# Patient Record
Sex: Male | Born: 1955 | Race: White | Hispanic: No | Marital: Married | Smoking: Current every day smoker
Health system: Southern US, Community
[De-identification: ages and names within clinical notes are randomized; demographics above are authoritative.]

## PROBLEM LIST (undated history)

## (undated) DIAGNOSIS — F419 Anxiety disorder, unspecified: Secondary | ICD-10-CM

## (undated) DIAGNOSIS — F329 Major depressive disorder, single episode, unspecified: Secondary | ICD-10-CM

## (undated) DIAGNOSIS — F32A Depression, unspecified: Secondary | ICD-10-CM

## (undated) DIAGNOSIS — K219 Gastro-esophageal reflux disease without esophagitis: Secondary | ICD-10-CM

## (undated) HISTORY — PX: CHOLECYSTECTOMY: SHX55

---

## 1999-02-13 ENCOUNTER — Ambulatory Visit: Admission: RE | Admit: 1999-02-13 | Discharge: 1999-02-13 | Payer: Self-pay | Admitting: Internal Medicine

## 2002-03-19 ENCOUNTER — Encounter: Payer: Self-pay | Admitting: Orthopedic Surgery

## 2002-03-23 ENCOUNTER — Inpatient Hospital Stay (HOSPITAL_COMMUNITY): Admission: RE | Admit: 2002-03-23 | Discharge: 2002-03-25 | Payer: Self-pay | Admitting: Orthopedic Surgery

## 2015-10-04 ENCOUNTER — Emergency Department: Payer: PRIVATE HEALTH INSURANCE

## 2015-10-04 ENCOUNTER — Emergency Department
Admission: EM | Admit: 2015-10-04 | Discharge: 2015-10-04 | Disposition: A | Discharge status: Home / Self Care | Payer: PRIVATE HEALTH INSURANCE | Attending: Emergency Medicine | Admitting: Emergency Medicine

## 2015-10-04 DIAGNOSIS — W01198A Fall on same level from slipping, tripping and stumbling with subsequent striking against other object, initial encounter: Secondary | ICD-10-CM | POA: Diagnosis not present

## 2015-10-04 DIAGNOSIS — F419 Anxiety disorder, unspecified: Secondary | ICD-10-CM | POA: Diagnosis not present

## 2015-10-04 DIAGNOSIS — S2231XA Fracture of one rib, right side, initial encounter for closed fracture: Secondary | ICD-10-CM

## 2015-10-04 DIAGNOSIS — F329 Major depressive disorder, single episode, unspecified: Secondary | ICD-10-CM | POA: Diagnosis not present

## 2015-10-04 DIAGNOSIS — S2241XA Multiple fractures of ribs, right side, initial encounter for closed fracture: Secondary | ICD-10-CM | POA: Insufficient documentation

## 2015-10-04 DIAGNOSIS — S299XXA Unspecified injury of thorax, initial encounter: Secondary | ICD-10-CM | POA: Diagnosis present

## 2015-10-04 DIAGNOSIS — Z79899 Other long term (current) drug therapy: Secondary | ICD-10-CM | POA: Diagnosis not present

## 2015-10-04 DIAGNOSIS — Y9289 Other specified places as the place of occurrence of the external cause: Secondary | ICD-10-CM | POA: Diagnosis not present

## 2015-10-04 DIAGNOSIS — Z720 Tobacco use: Secondary | ICD-10-CM | POA: Diagnosis not present

## 2015-10-04 DIAGNOSIS — Z880 Allergy status to penicillin: Secondary | ICD-10-CM | POA: Insufficient documentation

## 2015-10-04 DIAGNOSIS — Y9389 Activity, other specified: Secondary | ICD-10-CM | POA: Insufficient documentation

## 2015-10-04 DIAGNOSIS — Y998 Other external cause status: Secondary | ICD-10-CM | POA: Diagnosis not present

## 2015-10-04 HISTORY — DX: Depression, unspecified: F32.A

## 2015-10-04 HISTORY — DX: Major depressive disorder, single episode, unspecified: F32.9

## 2015-10-04 HISTORY — DX: Gastro-esophageal reflux disease without esophagitis: K21.9

## 2015-10-04 HISTORY — DX: Anxiety disorder, unspecified: F41.9

## 2015-10-04 MED ORDER — IBUPROFEN 800 MG PO TABS: 800.0000 mg | ORAL_TABLET | Freq: Three times a day (TID) | ORAL | Status: AC | PRN

## 2015-10-04 MED ORDER — OXYCODONE-ACETAMINOPHEN 7.5-325 MG PO TABS: 1.0000 | ORAL_TABLET | ORAL | Status: AC | PRN

## 2015-10-04 MED ORDER — KETOROLAC TROMETHAMINE 60 MG/2ML IM SOLN
60.0000 mg | Freq: Once | INTRAMUSCULAR | Status: AC
  Administered 2015-10-04: 60 mg via INTRAMUSCULAR
  Filled 2015-10-04: qty 2

## 2015-10-04 MED ORDER — HYDROMORPHONE HCL 1 MG/ML IJ SOLN
1.0000 mg | Freq: Once | INTRAMUSCULAR | Status: AC
  Administered 2015-10-04: 1 mg via INTRAMUSCULAR
  Filled 2015-10-04: qty 1

## 2015-10-04 NOTE — Discharge Instructions (Signed)
Rib Fracture °A rib fracture is a break or crack in one of the bones of the ribs. The ribs are like a cage that goes around your upper chest. A broken or cracked rib is often painful, but most do not cause other problems. Most rib fractures heal on their own in 1-3 months. °HOME CARE °· Avoid activities that cause pain to the injured area. Protect your injured area. °· Slowly increase activity as told by your doctor. °· Take medicine as told by your doctor. °· Put ice on the injured area for the first 1-2 days after you have been treated or as told by your doctor. °¨ Put ice in a plastic bag. °¨ Place a towel between your skin and the bag. °¨ Leave the ice on for 15-20 minutes at a time, every 2 hours while you are awake. °· Do deep breathing as told by your doctor. You may be told to: °¨ Take deep breaths many times a day. °¨ Cough many times a day while hugging a pillow. °¨ Use a device (incentive spirometer) to perform deep breathing many times a day. °· Drink enough fluids to keep your pee (urine) clear or pale yellow.   °· Do not wear a rib belt or binder. These do not allow you to breathe deeply. °GET HELP RIGHT AWAY IF:  °· You have a fever. °· You have trouble breathing.   °· You cannot stop coughing. °· You cough up thick or bloody spit (mucus).   °· You feel sick to your stomach (nauseous), throw up (vomit), or have belly (abdominal) pain.   °· Your pain gets worse and medicine does not help.   °MAKE SURE YOU:  °· Understand these instructions. °· Will watch your condition. °· Will get help right away if you are not doing well or get worse. °  °This information is not intended to replace advice given to you by your health care provider. Make sure you discuss any questions you have with your health care provider. °  °Document Released: 08/21/2008 Document Revised: 03/09/2013 Document Reviewed: 01/14/2013 °Elsevier Interactive Patient Education ©2016 [employer] ° °

## 2015-10-04 NOTE — ED Provider Notes (Signed)
[facility] Emergency Department Provider Note  ____________________________________________  Time seen: Approximately 12:32 PM  I have reviewed the triage vital signs and the nursing notes.   HISTORY  Chief Complaint Fall    HPI Micheal Frost is a 59 y.o. male patient complain right rib pain status post 3 falls in the past 4 days. Patient states pain increases with deep inspiration and coughing. Patient say his falls a being caused by tripping and a change in his medication he takes anxiety. Patient is rating his pain as a 10 over 10 described as sharp with deep inspiration or coughing. No palliative measures taken for this complaint. Patient last fall was prior to arrival.   Past Medical History  Diagnosis Date  . Anxiety   . GERD (gastroesophageal reflux disease)   . Depression     There are no active problems to display for this patient.   Past Surgical History  Procedure Laterality Date  . Cholecystectomy      Current Outpatient Rx  Name  Route  Sig  Dispense  Refill  . clonazePAM (KLONOPIN) 0.5 MG tablet   Oral   Take 0.5 mg by mouth 2 (two) times daily as needed for anxiety.         Marland Kitchen losartan (COZAAR) 50 MG tablet   Oral   Take 50 mg by mouth daily.         Marland Kitchen omeprazole (PRILOSEC) 40 MG capsule   Oral   Take 40 mg by mouth daily.           Allergies Penicillins  History reviewed. No pertinent family history.  Social History Social History  Substance Use Topics  . Smoking status: Current Every Day Smoker  . Smokeless tobacco: None  . Alcohol Use: Yes    Review of Systems Constitutional: No fever/chills Eyes: No visual changes. ENT: No sore throat. Cardiovascular: Denies chest pain. Respiratory: Denies shortness of breath. Gastrointestinal: No abdominal pain.  No nausea, no vomiting.  No diarrhea.  No constipation. Genitourinary: Negative for dysuria. Musculoskeletal: Right rib pain Skin: Negative for  rash. Neurological: Negative for headaches, focal weakness or numbness. Psychiatric: Anxiety and depression Allergic/Immunilogical: Penicillin  10-point ROS otherwise negative.  ____________________________________________   PHYSICAL EXAM:  VITAL SIGNS: ED Triage Vitals  Enc Vitals Group     BP 10/04/15 1206 143/83 mmHg     Pulse Rate 10/04/15 1206 84     Resp 10/04/15 1206 16     Temp 10/04/15 1206 98 F (36.7 C)     Temp Source 10/04/15 1206 Oral     SpO2 10/04/15 1206 100 %     Weight 10/04/15 1206 239 lb (108.41 kg)     Height 10/04/15 1206  (1.803 m)     Head Cir --      Peak Flow --      Pain Score 10/04/15 1206 10     Pain Loc --      Pain Edu? --      Excl. in GC? --     Constitutional: Alert and oriented. Well appearing and in no acute distress. Eyes: Conjunctivae are normal. PERRL. EOMI. Head: Atraumatic. Nose: No congestion/rhinnorhea. Mouth/Throat: Mucous membranes are moist.  Oropharynx non-erythematous. Neck: No stridor.  No cervical spine tenderness to palpation. Hematological/Lymphatic/Immunilogical: No cervical lymphadenopathy. Cardiovascular: Normal rate, regular rhythm. Grossly normal heart sounds.  Good peripheral circulation. Respiratory: Normal respiratory effort.  No retractions. Lungs CTAB. Gastrointestinal: Soft and nontender. No distention. No abdominal  bruits. No CVA tenderness. Musculoskeletal: No chest wall deformity. Decreased chest expansion secondary to complain of pain with inspiration. No abrasion or ecchymosis visible.  Neurologic:  Normal speech and language. No gross focal neurologic deficits are appreciated. No gait instability. Skin:  Skin is warm, dry and intact. No rash noted. Psychiatric: Mood and affect are normal. Speech and behavior are normal.  ____________________________________________   LABS (all labs ordered are listed, but only abnormal results are displayed)  Labs Reviewed - No data to  display ____________________________________________  EKG   ____________________________________________  RADIOLOGY  X-rays reveal acute fracture right lateral ribs #3 and 4. I, Joni Reiningonald K Hue Steveson, personally viewed and evaluated these images (plain radiographs) as part of my medical decision making.   ____________________________________________   PROCEDURES  Procedure(s) performed: None  Critical Care performed: No  ____________________________________________   INITIAL IMPRESSION / ASSESSMENT AND PLAN / ED COURSE  Pertinent labs & imaging results that were available during my care of the patient were reviewed by me and considered in my medical decision making (see chart for details).  Right rib fractures. Skull is x-ray findings with patient. She is given discharge instruction for rib fractures. Patient will follow-up with family doctor home station. Patient given prescription for Percocets and ibuprofen. Patient advised return by ER if condition worsens. ____________________________________________   FINAL CLINICAL IMPRESSION(S) / ED DIAGNOSES  Final diagnoses:  Rib fractures, right, closed, initial encounter      Joni ReiningRonald K Benedicta Sultan, PA-C 10/04/15 1333  Sharman CheekPhillip Stafford, MD 10/04/15 [id number]

## 2015-10-04 NOTE — ED Notes (Signed)
Patient has had 3 falls in the past 4 days and comes in today with complaints of right lateral chest pain that hurts worse with deep breaths and coughing.  Patient reports that he has had frequent falls due to medication being changed for anxiety.  Patient reports starting new prescription klonopin 0.5 mg and antidepressant that he can not remember.

## 2021-04-15 ENCOUNTER — Emergency Department: Payer: Medicare Other

## 2021-04-15 ENCOUNTER — Ambulatory Visit (HOSPITAL_COMMUNITY)
Admission: AD | Admit: 2021-04-15 | Discharge: 2021-04-15 | Disposition: A | Discharge status: Home / Self Care | Payer: Medicare Other | Source: Other Acute Inpatient Hospital | Attending: Internal Medicine | Admitting: Internal Medicine

## 2021-04-15 ENCOUNTER — Emergency Department
Admission: EM | Admit: 2021-04-15 | Discharge: 2021-04-16 | Disposition: A | Discharge status: Other Acute Inpatient Hospital | Payer: Medicare Other | Attending: Emergency Medicine | Admitting: Emergency Medicine

## 2021-04-15 ENCOUNTER — Inpatient Hospital Stay
Admission: AD | Admit: 2021-04-15 | Payer: Medicare Other | Source: Other Acute Inpatient Hospital | Admitting: Internal Medicine

## 2021-04-15 DIAGNOSIS — G40901 Epilepsy, unspecified, not intractable, with status epilepticus: Secondary | ICD-10-CM

## 2021-04-15 DIAGNOSIS — Z978 Presence of other specified devices: Secondary | ICD-10-CM

## 2021-04-15 DIAGNOSIS — I959 Hypotension, unspecified: Secondary | ICD-10-CM

## 2021-04-15 DIAGNOSIS — R4182 Altered mental status, unspecified: Secondary | ICD-10-CM

## 2021-04-15 DIAGNOSIS — I214 Non-ST elevation (NSTEMI) myocardial infarction: Secondary | ICD-10-CM

## 2021-04-15 DIAGNOSIS — J9690 Respiratory failure, unspecified, unspecified whether with hypoxia or hypercapnia: Secondary | ICD-10-CM | POA: Insufficient documentation

## 2021-04-15 DIAGNOSIS — Z20822 Contact with and (suspected) exposure to covid-19: Secondary | ICD-10-CM | POA: Diagnosis not present

## 2021-04-15 DIAGNOSIS — R000 Tachycardia, unspecified: Secondary | ICD-10-CM | POA: Insufficient documentation

## 2021-04-15 DIAGNOSIS — R9431 Abnormal electrocardiogram [ECG] [EKG]: Secondary | ICD-10-CM | POA: Diagnosis not present

## 2021-04-15 DIAGNOSIS — I493 Ventricular premature depolarization: Secondary | ICD-10-CM | POA: Insufficient documentation

## 2021-04-15 DIAGNOSIS — R778 Other specified abnormalities of plasma proteins: Secondary | ICD-10-CM | POA: Diagnosis not present

## 2021-04-15 DIAGNOSIS — R051 Acute cough: Secondary | ICD-10-CM | POA: Diagnosis not present

## 2021-04-15 DIAGNOSIS — R569 Unspecified convulsions: Secondary | ICD-10-CM | POA: Diagnosis present

## 2021-04-15 LAB — PROTIME-INR
INR: 1.4 — ABNORMAL HIGH (ref 0.8–1.2)
Prothrombin Time: 17.3 seconds — ABNORMAL HIGH (ref 11.4–15.2)

## 2021-04-15 LAB — URINE DRUG SCREEN, QUALITATIVE (ARMC ONLY)
Amphetamines, Ur Screen: NOT DETECTED
Barbiturates, Ur Screen: NOT DETECTED
Benzodiazepine, Ur Scrn: NOT DETECTED
Cannabinoid 50 Ng, Ur ~~LOC~~: POSITIVE — AB
Cocaine Metabolite,Ur ~~LOC~~: NOT DETECTED
MDMA (Ecstasy)Ur Screen: NOT DETECTED
Methadone Scn, Ur: NOT DETECTED
Opiate, Ur Screen: NOT DETECTED
Phencyclidine (PCP) Ur S: NOT DETECTED
Tricyclic, Ur Screen: NOT DETECTED

## 2021-04-15 LAB — COMPREHENSIVE METABOLIC PANEL
ALT: 15 U/L (ref 0–44)
AST: 35 U/L (ref 15–41)
Albumin: 3.7 g/dL (ref 3.5–5.0)
Alkaline Phosphatase: 121 U/L (ref 38–126)
Anion gap: 18 — ABNORMAL HIGH (ref 5–15)
BUN: 13 mg/dL (ref 8–23)
CO2: 14 mmol/L — ABNORMAL LOW (ref 22–32)
Calcium: 9.5 mg/dL (ref 8.9–10.3)
Chloride: 106 mmol/L (ref 98–111)
Creatinine, Ser: 1.11 mg/dL (ref 0.61–1.24)
GFR, Estimated: >60 mL/min (ref >60)
Glucose, Bld: 179 mg/dL — ABNORMAL HIGH (ref 70–99)
Potassium: 4.3 mmol/L (ref 3.5–5.1)
Sodium: 138 mmol/L (ref 135–145)
Total Bilirubin: 0.7 mg/dL (ref 0.3–1.2)
Total Protein: 8 g/dL (ref 6.5–8.1)

## 2021-04-15 LAB — URINALYSIS, ROUTINE W REFLEX MICROSCOPIC
Bacteria, UA: NONE SEEN
Bilirubin Urine: NEGATIVE
Glucose, UA: 50 mg/dL — AB
Ketones, ur: NEGATIVE mg/dL
Leukocytes,Ua: NEGATIVE
Nitrite: NEGATIVE
Protein, ur: 100 mg/dL — AB
Specific Gravity, Urine: 1.02 (ref 1.005–1.030)
pH: 5 (ref 5.0–8.0)

## 2021-04-15 LAB — TROPONIN I (HIGH SENSITIVITY)
Troponin I (High Sensitivity): 19 ng/L — ABNORMAL HIGH (ref <18)
Troponin I (High Sensitivity): 321 ng/L (ref <18)

## 2021-04-15 LAB — RESP PANEL BY RT-PCR (FLU A&B, COVID) ARPGX2
Influenza A by PCR: NEGATIVE
Influenza B by PCR: NEGATIVE
SARS Coronavirus 2 by RT PCR: NEGATIVE

## 2021-04-15 LAB — CBC WITH DIFFERENTIAL/PLATELET
Abs Immature Granulocytes: 0.1 10*3/uL — ABNORMAL HIGH (ref 0.00–0.07)
Basophils Absolute: 0.2 10*3/uL — ABNORMAL HIGH (ref 0.0–0.1)
Basophils Relative: 1 %
Eosinophils Absolute: 0.5 10*3/uL (ref 0.0–0.5)
Eosinophils Relative: 4 %
HCT: 34.5 % — ABNORMAL LOW (ref 39.0–52.0)
Hemoglobin: 10.3 g/dL — ABNORMAL LOW (ref 13.0–17.0)
Immature Granulocytes: 1 %
Lymphocytes Relative: 41 %
Lymphs Abs: 5.2 10*3/uL — ABNORMAL HIGH (ref 0.7–4.0)
MCH: 26.2 pg (ref 26.0–34.0)
MCHC: 29.9 g/dL — ABNORMAL LOW (ref 30.0–36.0)
MCV: 87.8 fL (ref 80.0–100.0)
Monocytes Absolute: 1.3 10*3/uL — ABNORMAL HIGH (ref 0.1–1.0)
Monocytes Relative: 10 %
Neutro Abs: 5.3 10*3/uL (ref 1.7–7.7)
Neutrophils Relative %: 43 %
Platelets: 256 10*3/uL (ref 150–400)
RBC: 3.93 MIL/uL — ABNORMAL LOW (ref 4.22–5.81)
RDW: 19.6 % — ABNORMAL HIGH (ref 11.5–15.5)
WBC: 12.6 10*3/uL — ABNORMAL HIGH (ref 4.0–10.5)
nRBC: 0 % (ref 0.0–0.2)

## 2021-04-15 LAB — CBG MONITORING, ED: Glucose-Capillary: 151 mg/dL — ABNORMAL HIGH (ref 70–99)

## 2021-04-15 LAB — ETHANOL: Alcohol, Ethyl (B): <10 mg/dL (ref <10)

## 2021-04-15 LAB — APTT: aPTT: 43 seconds — ABNORMAL HIGH (ref 24–36)

## 2021-04-15 LAB — MAGNESIUM: Magnesium: 2.1 mg/dL (ref 1.7–2.4)

## 2021-04-15 LAB — AMMONIA: Ammonia: 142 umol/L — ABNORMAL HIGH (ref 9–35)

## 2021-04-15 MED ORDER — LORAZEPAM 2 MG/ML IJ SOLN
INTRAMUSCULAR | Status: AC
  Filled 2021-04-15: qty 1

## 2021-04-15 MED ORDER — FENTANYL 2500MCG IN NS 250ML (10MCG/ML) PREMIX INFUSION
0.0000 ug/h | INTRAVENOUS | Status: DC
  Administered 2021-04-15: 25 ug/h via INTRAVENOUS
  Filled 2021-04-15: qty 250

## 2021-04-15 MED ORDER — ROCURONIUM BROMIDE 50 MG/5ML IV SOLN
100.0000 mg | Freq: Once | INTRAVENOUS | Status: AC
  Administered 2021-04-15: 100 mg via INTRAVENOUS
  Filled 2021-04-15: qty 10

## 2021-04-15 MED ORDER — LORAZEPAM 2 MG/ML IJ SOLN
4.0000 mg | Freq: Once | INTRAMUSCULAR | Status: AC
  Administered 2021-04-15: 4 mg via INTRAVENOUS
  Filled 2021-04-15: qty 2

## 2021-04-15 MED ORDER — PROPOFOL 1000 MG/100ML IV EMUL: 5.0000 ug/kg/min | INTRAVENOUS | Status: DC

## 2021-04-15 MED ORDER — SODIUM CHLORIDE 0.9 % IV BOLUS
500.0000 mL | Freq: Once | INTRAVENOUS | Status: AC
  Administered 2021-04-15: 500 mL via INTRAVENOUS

## 2021-04-15 MED ORDER — LACTULOSE 10 GM/15ML PO SOLN: 20.0000 g | Freq: Once | ORAL | Status: DC

## 2021-04-15 MED ORDER — LEVETIRACETAM IN NACL 1000 MG/100ML IV SOLN
1000.0000 mg | Freq: Once | INTRAVENOUS | Status: AC
  Administered 2021-04-15: 1000 mg via INTRAVENOUS

## 2021-04-15 MED ORDER — LORAZEPAM 2 MG/ML IJ SOLN
INTRAMUSCULAR | Status: AC
  Filled 2021-04-15: qty 2

## 2021-04-15 MED ORDER — PROPOFOL 1000 MG/100ML IV EMUL
INTRAVENOUS | Status: AC
  Administered 2021-04-15: 50 ug/kg/min via INTRAVENOUS
  Filled 2021-04-15: qty 100

## 2021-04-15 MED ORDER — RIFAXIMIN 550 MG PO TABS
550.0000 mg | ORAL_TABLET | Freq: Two times a day (BID) | ORAL | Status: DC
  Administered 2021-04-16: 550 mg
  Filled 2021-04-15 (×2): qty 1

## 2021-04-15 MED ORDER — SODIUM CHLORIDE 0.9 % IV BOLUS
1000.0000 mL | Freq: Once | INTRAVENOUS | Status: AC
  Administered 2021-04-15: 1000 mL via INTRAVENOUS

## 2021-04-15 MED ORDER — SODIUM CHLORIDE 0.9 % IV SOLN: 2000.0000 mg | Freq: Once | INTRAVENOUS | Status: DC

## 2021-04-15 MED ORDER — ETOMIDATE 2 MG/ML IV SOLN
20.0000 mg | Freq: Once | INTRAVENOUS | Status: AC
  Administered 2021-04-15: 20 mg via INTRAVENOUS
  Filled 2021-04-15: qty 10

## 2021-04-15 MED ORDER — IOHEXOL 350 MG/ML SOLN
75.0000 mL | Freq: Once | INTRAVENOUS | Status: AC | PRN
  Administered 2021-04-15: 75 mL via INTRAVENOUS

## 2021-04-15 MED ORDER — PROPOFOL 1000 MG/100ML IV EMUL
INTRAVENOUS | Status: AC
  Administered 2021-04-15: 20 ug/kg/min via INTRAVENOUS
  Filled 2021-04-15: qty 100

## 2021-04-15 NOTE — Progress Notes (Signed)
Cardiology Brief Note Asked by ER to review ekgs Pt reported intubated and sz Troponin with borderline increase 300 Ekg suggest mild diffuse ST t abnormality NSR 70 mild ST elevation inf lateral leads  Doesn,t meet STEMI criteria. No prior ekg s to compare Recommend Conservative cardiac therapy for now. Work on mental status changes seizure and extubation Follow-up EKGs troponins Consider echocardiogram for left ventricular function wall motion and valvular structures Thanks,Skilynn Durney

## 2021-04-15 NOTE — ED Notes (Signed)
Patient currently in post-ictal state. Plan to go for stat head CT. Patient on non-rebreather and maintaining airway. Bigeminy shown on EKG

## 2021-04-15 NOTE — ED Notes (Signed)
Patient transported to CT with 3RNs RT and MD.

## 2021-04-15 NOTE — ED Notes (Signed)
Patient wife brought back to bedside. States patient has had one seizure in the past approx 9 years ago due to hypoglycemia. Not taking any seizure medications. Patient and family were out to eat and pt stated that he thought his BGL was low this evening. Patient brought sweet tea and orange juice. Approx 40 minutes later pt wife reports he had a 10 minute seizure and EMS was called on scene.

## 2021-04-15 NOTE — ED Notes (Signed)
Patient initially unresponsive with medic after seizure like activity. MD RT and multiple RNs at bedside. Suction on and ready. Intubation kit at bedside.

## 2021-04-15 NOTE — ED Triage Notes (Signed)
Patient presents via EMS with a cc of seizures. Patient had a witnessed seizure while out today eating with family. Unknown prior seizure hx. Fire on scene and seizure broke without medication. Upon entering ED, patient was noted to have tonic clonic seizure with eye deviation to the right and posturing. CBG 112. 2mg  of Versed given in the field and an additional 2mg  given upon entering room with minimal relief. Patient initially being bagged by MEDIC and transferred over to non-rebreather. Tachy. Pupils unequal.

## 2021-04-15 NOTE — ED Notes (Signed)
Asher Muir called for update status for pt, ICU will call back

## 2021-04-15 NOTE — ED Notes (Signed)
Ruby at [facility] to initiate critical care transfer

## 2021-04-15 NOTE — ED Notes (Signed)
Patient back to room from CT

## 2021-04-15 NOTE — ED Notes (Signed)
Patient began seizing again. Preparing for RSI.

## 2021-04-15 NOTE — ED Notes (Signed)
CBG 150s.

## 2021-04-15 NOTE — ED Notes (Signed)
Patient blood pressure 100s systolic. Continually dropping. Map still maintained above 85. Provider aware. Plan to titrate propofol down to .

## 2021-04-15 NOTE — ED Provider Notes (Signed)
[facility] Emergency Department Provider Note  ____________________________________________   Event Date/Time   First MD Initiated Contact with Patient 04/15/21 1933     (approximate)  I have reviewed the triage vital signs and the nursing notes.   HISTORY  Chief Complaint Seizures    HPI Micheal Frost is a 65 y.o. male unknown medical history who comes in with seizure-like activity.  Patient was eating food at the restaurant when he stated that he felt like he was going to go down.  Patient started having seizure-like activity noted by the wife.  Seizing has stopped with EMS arrival and patient was postictal.  Patient then began posturing with eye deviation to the right.  Patient was given 2.5 via the Versed which briefly stopped the seizure but then he restarted again.  Patient was given 4 mg of Ativan by myself and 2 g of Keppra but continued to have seizures therefore patient was intubated for airway protection.  Unable to get full HPI due to patient's altered mental status     Unable to get full medical history due to altered mental status   Allergies Patient has no allergy information on record.  No family history on file.  Social History Unknown social history  Review of Systems Unable to get full review of systems due to altered mental status ____________________________________________   PHYSICAL EXAM:  VITAL SIGNS: ED Triage Vitals [04/15/21 1928]  Enc Vitals Group     BP (!) 164/88     Pulse Rate (!) 167     Resp (!) 32     Temp      Temp src      SpO2 94 %     Weight      Height      Head Circumference      Peak Flow      Pain Score      Pain Loc      Pain Edu?      Excl. in GC?     Constitutional: Altered, acute distress Eyes: Right pupil larger than left pupil with eye deviation to the right Head: Atraumatic. Nose: No congestion/rhinnorhea. Mouth/Throat: Mucous membranes are moist.   Neck: No stridor.  Trachea Midline. FROM Cardiovascular: Tachycardic, regular rhythm. Grossly normal heart sounds.  Good peripheral circulation. Respiratory: Normal respiratory effort.  No retractions. Lungs CTAB. Gastrointestinal: Soft and nontender. No distention. No abdominal bruits.  Musculoskeletal: No lower extremity tenderness nor edema.  No joint effusions. Neurologic: Patient is nonresponsive with eye deviation and generalized tonic-clonic and some posturing on the right side Skin:  Skin is warm, dry and intact. No rash noted. Psychiatric: Unable to assess GU: Deferred   ____________________________________________   LABS (all labs ordered are listed, but only abnormal results are displayed)  Labs Reviewed  CBC WITH DIFFERENTIAL/PLATELET - Abnormal; Notable for the following components:      Result Value   WBC 12.6 (*)    RBC 3.93 (*)    Hemoglobin 10.3 (*)    HCT 34.5 (*)    MCHC 29.9 (*)    RDW 19.6 (*)    Lymphs Abs 5.2 (*)    Monocytes Absolute 1.3 (*)    Basophils Absolute 0.2 (*)    Abs Immature Granulocytes 0.10 (*)    All other components within normal limits  CBG MONITORING, ED - Abnormal; Notable for the following components:   Glucose-Capillary 151 (*)    All other components within normal limits  RESP  PANEL BY RT-PCR (FLU A&B, COVID) ARPGX2  COMPREHENSIVE METABOLIC PANEL  MAGNESIUM  ETHANOL  URINALYSIS, ROUTINE W REFLEX MICROSCOPIC  URINE DRUG SCREEN, QUALITATIVE ([facility] ONLY)  CBG MONITORING, ED  TROPONIN I (HIGH SENSITIVITY)   ____________________________________________   ED ECG REPORT I, Concha SeMary E Lazlo Tunney, the attending physician, personally viewed and interpreted this ECG.  EKG shows ventricular bigeminy rate of 136, no ST elevation, no T wave versions, normal intervals  Sinus rate of 82, some minimal ST elevation in V3 through V6 leads II, III, aVF, T wave version aVL ____________________________________________  RADIOLOGY Vela ProseI, Lorell Thibodaux E Khori Underberg, personally viewed and  evaluated these images (plain radiographs) as part of my medical decision making, as well as reviewing the written report by the radiologist.  ED MD interpretation: ET tube looks in place  Official radiology report(s): CT ANGIO HEAD NECK W WO CM  Result Date: 04/15/2021 CLINICAL DATA:  Seizure EXAM: CT ANGIOGRAPHY HEAD AND NECK TECHNIQUE: Multidetector CT imaging of the head and neck was performed using the standard protocol during bolus administration of intravenous contrast. Multiplanar CT image reconstructions and MIPs were obtained to evaluate the vascular anatomy. Carotid stenosis measurements (when applicable) are obtained utilizing NASCET criteria, using the distal internal carotid diameter as the denominator. CONTRAST:  75mL OMNIPAQUE IOHEXOL 350 MG/ML SOLN COMPARISON:  None. FINDINGS: CTA NECK FINDINGS SKELETON: There is no bony spinal canal stenosis. No lytic or blastic lesion. OTHER NECK: Normal pharynx, larynx and major salivary glands. No cervical lymphadenopathy. Unremarkable thyroid gland. UPPER CHEST: No pneumothorax or pleural effusion. No nodules or masses. AORTIC ARCH: There is calcific atherosclerosis of the aortic arch. There is no aneurysm, dissection or hemodynamically significant stenosis of the visualized portion of the aorta. Conventional 3 vessel aortic branching pattern. The visualized proximal subclavian arteries are widely patent. RIGHT CAROTID SYSTEM: Normal without aneurysm, dissection or stenosis. LEFT CAROTID SYSTEM: Normal without aneurysm, dissection or stenosis. VERTEBRAL ARTERIES: Left dominant configuration. Both origins are clearly patent. There is no dissection, occlusion or flow-limiting stenosis to the skull base (V1-V3 segments). CTA HEAD FINDINGS POSTERIOR CIRCULATION: --Vertebral arteries: Normal V4 segments. --Inferior cerebellar arteries: Normal. --Basilar artery: Normal. --Superior cerebellar arteries: Normal. --Posterior cerebral arteries (PCA): Normal.  ANTERIOR CIRCULATION: --Intracranial internal carotid arteries: Atherosclerotic calcification of the internal carotid arteries at the skull base without hemodynamically significant stenosis. --Anterior cerebral arteries (ACA): Normal. Both A1 segments are present. Patent anterior communicating artery (a-comm). --Middle cerebral arteries (MCA): Normal. VENOUS SINUSES: As permitted by contrast timing, patent. ANATOMIC VARIANTS: None Review of the MIP images confirms the above findings. IMPRESSION: 1. No emergent large vessel occlusion or high-grade stenosis of the intracranial arteries. Aortic Atherosclerosis (ICD10-I70.0). Electronically Signed   By: Deatra RobinsonKevin  Herman M.D.   On: 04/15/2021 20:28   DG Chest 1 View  Result Date: 04/15/2021 CLINICAL DATA:  Stat post seizure with subsequent endotracheal tube placement. EXAM: CHEST  1 VIEW COMPARISON:  October 04, 2015 FINDINGS: An endotracheal tube is seen with its distal tip approximately 5.3 cm from the carina. Mildly increased bilateral perihilar bronchovascular lung markings are seen with mild prominence of the perihilar pulmonary vasculature. There is no evidence of focal consolidation, pleural effusion or pneumothorax. The heart size and mediastinal contours are within normal limits. Bilateral radiopaque shoulder replacements are noted. IMPRESSION: 1. Endotracheal tube positioning, as described above, with additional findings suggestive of mild pulmonary vascular congestion. Electronically Signed   By: Aram Candelahaddeus  Houston M.D.   On: 04/15/2021 20:01   CT Head Wo Contrast  Result Date: 04/15/2021 CLINICAL DATA:  Head trauma, minor (Age >= 65y) Seizure. EXAM: CT HEAD WITHOUT CONTRAST TECHNIQUE: Contiguous axial images were obtained from the base of the skull through the vertex without intravenous contrast. COMPARISON:  None. FINDINGS: Brain: No evidence of acute infarction, hemorrhage, hydrocephalus, extra-axial collection or mass lesion/mass effect. Generalized  cerebral atrophy. Vascular: Atherosclerosis of skullbase vasculature. CTA performed and reported separately for detailed vascular assessment. Skull: No acute fracture. There is thinning of the outer table of the left parietal skull but no discrete lesion or bony destruction. Sinuses/Orbits: Paranasal sinus mucosal thickening. Small mucous retention cyst in the right maxillary sinus. No mastoid effusion. Unremarkable orbits. Other: No confluent scalp contusion. No scalp abnormality overlying the calvarial thinning. IMPRESSION: 1. No acute intracranial abnormality. No skull fracture. 2. Generalized cerebral atrophy. 3. Thinning of the outer table of the left parietal skull but no discrete lesion or bony destruction. This is presumably chronic. Electronically Signed   By: Narda Rutherford M.D.   On: 04/15/2021 20:28   CT Cervical Spine Wo Contrast  Result Date: 04/15/2021 CLINICAL DATA:  Seizure EXAM: CT CERVICAL SPINE WITHOUT CONTRAST TECHNIQUE: Multidetector CT imaging of the cervical spine was performed without intravenous contrast. Multiplanar CT image reconstructions were also generated. COMPARISON:  None. FINDINGS: Alignment: Reversal of normal cervical lordosis. No static subluxation. Skull base and vertebrae: No acute fracture. Soft tissues and spinal canal: No prevertebral fluid or swelling. No visible canal hematoma. Disc levels: Multilevel degenerative disc disease. No bony spinal canal stenosis. Facet disease is worst at left C3-4. Upper chest: Negative. Other: None. IMPRESSION: No acute fracture or static subluxation of the cervical spine. Electronically Signed   By: Deatra Robinson M.D.   On: 04/15/2021 20:30    ____________________________________________   PROCEDURES  Procedure(s) performed (including Critical Care):  .1-3 Lead EKG Interpretation Performed by: Concha Se, MD Authorized by: Concha Se, MD     Interpretation: abnormal     ECG rate:  70s   ECG rate assessment:  normal     Rhythm: sinus rhythm     Ectopy: none     Conduction: normal   Comments:     Patient has had some runs of PVCs and had some bigeminy with tachycardia initially but ended up returning to normal sinus .Critical Care Performed by: Concha Se, MD Authorized by: Concha Se, MD   Critical care provider statement:    Critical care time (minutes):  65   Critical care was necessary to treat or prevent imminent or life-threatening deterioration of the following conditions:  CNS failure or compromise   Critical care was time spent personally by me on the following activities:  Discussions with consultants, evaluation of patient's response to treatment, examination of patient, ordering and performing treatments and interventions, ordering and review of laboratory studies, ordering and review of radiographic studies, pulse oximetry, re-evaluation of patient's condition, obtaining history from patient or surrogate and review of old charts Procedure Name: Intubation Date/Time: 04/16/2021 12:18 AM Performed by: Concha Se, MD Pre-anesthesia Checklist: Patient identified, Patient being monitored, Emergency Drugs available, Timeout performed and Suction available Oxygen Delivery Method: Non-rebreather mask Preoxygenation: Pre-oxygenation with 100% oxygen Induction Type: Rapid sequence Ventilation: Mask ventilation without difficulty Laryngoscope Size: Glidescope and 4 Grade View: Grade I Tube size: 7.5 mm Number of attempts: 1 Airway Equipment and Method: Rigid stylet Placement Confirmation: ETT inserted through vocal cords under direct vision,  CO2 detector and Breath sounds checked- equal and bilateral  Tube secured with: ETT holder        ____________________________________________   INITIAL IMPRESSION / ASSESSMENT AND PLAN / ED COURSE  Tillman Kazmierski was evaluated in Emergency Department on 04/15/2021 for the symptoms described in the history of present illness. He  was evaluated in the context of the global COVID-19 pandemic, which necessitated consideration that the patient might be at risk for infection with the SARS-CoV-2 virus that causes COVID-19. Institutional protocols and algorithms that pertain to the evaluation of patients at risk for COVID-19 are in a state of rapid change based on information released by regulatory bodies including the CDC and federal and state organizations. These policies and algorithms were followed during the patient's care in the ED.    Patient comes in with concerns for status epilepticus, posturing, airway not being protected therefore patient was intubated.  Concerning for intracranial hemorrhage, cervical fracture, subarachnoid hemorrhage.  Patient also in bigeminy so consider cardiac cause.  We will keep patient the cardiac monitor.  Patient was given IV Ativan, IV Keppra with continued seizures therefore patient was intubated in order to facilitate CT imaging due to high concern for intercranial hemorrhage.  CT imaging was negative.   Cardiac marker was trending up but I be nervous to start patient on heparin due to concern that patient had a neurological process including the possibility of stroke.  His EKG did have a little minimal ST elevation but did not meet STEMI criteria.  There was no ST depression.  We will continue to trend his cardiac markers.  9:06 PM Dr. Peggye Pitt accepting from [facility].  Patient is getting rifaximin and lactulose per ICU doctor  Unfortunately [facility] will not have any beds tonight.  I talked to our ICU team to see if they can hold the patient till patient can go over there but due to patient needing a continuous EEG they recommended that we talk to [facility] or [facility].    Therefore I did talk with Dr. Dan Humphreys from [facility] who accepted patient and stated they would have a bed tonight.  Patient's blood pressures have slightly trended down so giving him a liter of fluid.  He continues to not have any  purposeful movement on low doses of sedation patient.             ____________________________________________   FINAL CLINICAL IMPRESSION(S) / ED DIAGNOSES   Final diagnoses:  Status epilepticus (HCC)  Elevated troponin  Altered mental status, unspecified altered mental status type      MEDICATIONS GIVEN DURING THIS VISIT:  Medications  LORazepam (ATIVAN) 2 MG/ML injection (has no administration in time range)  levETIRAcetam (KEPPRA) IVPB 1000 mg/100 mL premix (1,000 mg Intravenous New Bag/Given 04/15/21 1939)    Followed by  levETIRAcetam (KEPPRA) IVPB 1000 mg/100 mL premix (has no administration in time range)  propofol (DIPRIVAN) 1000 MG/100ML infusion (has no administration in time range)  fentaNYL in NS (66mcg/ml) infusion-PREMIX (has no administration in time range)  propofol (DIPRIVAN) 1000 MG/100ML infusion (has no administration in time range)  etomidate (AMIDATE) injection 20 mg (20 mg Intravenous Given 04/15/21 1940)  rocuronium (ZEMURON) injection 100 mg (100 mg Intravenous Given 04/15/21 1940)  LORazepam (ATIVAN) injection 4 mg (4 mg Intravenous Given 04/15/21 1926)     ED Discharge Orders    None       Note:  This document was prepared using Dragon voice recognition software and may include unintentional dictation errors.   Concha Se, MD  04/16/21 0022  

## 2021-04-15 NOTE — Progress Notes (Incomplete)
[facility]Link Physician-Brief Progress Note Patient Name: Micheal Frost DOB: 10/01/1956 MRN: [id number]   Date of Service  04/15/2021  HPI/Events of Note  Brief new admit note:  Transferred from [facility] ED for continuous EEG monitoring. On Vent. For status epilepticus.   Camera: VS:  VENT: On propofol and fenta. Data: Reviewed.  Cannabinoid +. co2 14, Trop 19, EKG bi gemini.  wbc 12.6, Hg 10.3, ETOH < 10. CT head, neck-spine and CTA: no acute findings noted.  CxR: ET in place, perihilar congestion.can not rule out rt lower lobe asp pneumonitis.   A/P  Status epilepticus, on Ventilator for airway protection. - seizure precautions - s/p keppra and on sedation - Neurology consultation - follow ABG prn and wean vent as tolerated - watch for new fever, worsening hypoxemia- for asp pneumonia?. - trend troponin. Lytes ok.  GXQ:JJHERDE/YCXKGYJ: on  CBG: goal < 180  eICU Interventions       Intervention Category Major Interventions: Seizures - evaluation and management Evaluation Type: New Patient Evaluation  Ranee Gosselin 04/15/2021, 9:06 PM

## 2021-04-15 NOTE — ED Notes (Signed)
Micheal Frost called to initiate transfer

## 2021-04-16 ENCOUNTER — Emergency Department: Payer: Medicare Other

## 2021-04-16 LAB — CBG MONITORING, ED
Glucose-Capillary: 87 mg/dL (ref 70–99)
Glucose-Capillary: 30 mg/dL — CL (ref 70–99)
Glucose-Capillary: 39 mg/dL — CL (ref 70–99)
Glucose-Capillary: 392 mg/dL — ABNORMAL HIGH (ref 70–99)

## 2021-04-16 LAB — PROCALCITONIN: Procalcitonin: <0.1 ng/mL

## 2021-04-16 LAB — TROPONIN I (HIGH SENSITIVITY)
Troponin I (High Sensitivity): 1986 ng/L (ref <18)
Troponin I (High Sensitivity): 810 ng/L (ref <18)

## 2021-04-16 LAB — LACTIC ACID, PLASMA: Lactic Acid, Venous: 2.1 mmol/L (ref 0.5–1.9)

## 2021-04-16 MED ORDER — NOREPINEPHRINE 4 MG/250ML-% IV SOLN
0.0000 ug/min | INTRAVENOUS | Status: DC
  Administered 2021-04-16: 5 ug/min via INTRAVENOUS
  Filled 2021-04-16: qty 250

## 2021-04-16 MED ORDER — SODIUM CHLORIDE 0.9 % IV BOLUS
1000.0000 mL | Freq: Once | INTRAVENOUS | Status: AC
  Administered 2021-04-16: 1000 mL via INTRAVENOUS

## 2021-04-16 MED ORDER — LACTATED RINGERS IV BOLUS
1000.0000 mL | Freq: Once | INTRAVENOUS | Status: AC
  Administered 2021-04-16: 1000 mL via INTRAVENOUS

## 2021-04-16 MED ORDER — ASPIRIN 300 MG RE SUPP
300.0000 mg | Freq: Once | RECTAL | Status: AC
  Administered 2021-04-16: 300 mg via RECTAL
  Filled 2021-04-16: qty 1

## 2021-04-16 MED ORDER — DEXTROSE 50 % IV SOLN
INTRAVENOUS | Status: AC
  Administered 2021-04-16: 25 g via INTRAVENOUS
  Filled 2021-04-16: qty 50

## 2021-04-16 MED ORDER — LEVETIRACETAM IN NACL 1500 MG/100ML IV SOLN
1500.0000 mg | Freq: Two times a day (BID) | INTRAVENOUS | Status: DC
  Administered 2021-04-16: 1500 mg via INTRAVENOUS
  Filled 2021-04-16 (×2): qty 100

## 2021-04-16 MED ORDER — VANCOMYCIN HCL 2000 MG/400ML IV SOLN
2000.0000 mg | Freq: Once | INTRAVENOUS | Status: AC
  Administered 2021-04-16: 2000 mg via INTRAVENOUS
  Filled 2021-04-16: qty 400

## 2021-04-16 MED ORDER — DEXTROSE 50 % IV SOLN: 25.0000 g | Freq: Once | INTRAVENOUS | Status: AC

## 2021-04-16 MED ORDER — SODIUM CHLORIDE 0.9 % IV SOLN
2.0000 g | Freq: Once | INTRAVENOUS | Status: AC
  Administered 2021-04-16: 2 g via INTRAVENOUS
  Filled 2021-04-16: qty 2

## 2021-04-16 MED ORDER — DEXTROSE 5 % IV SOLN: Freq: Once | INTRAVENOUS | Status: AC

## 2021-04-16 MED ORDER — MIDAZOLAM 50MG/50ML (1MG/ML) PREMIX INFUSION
0.5000 mg/h | INTRAVENOUS | Status: DC
  Administered 2021-04-16: 0.5 mg/h via INTRAVENOUS
  Filled 2021-04-16: qty 50

## 2021-04-16 MED ORDER — FENTANYL CITRATE (PF) 100 MCG/2ML IJ SOLN
50.0000 ug | Freq: Once | INTRAMUSCULAR | Status: AC
  Administered 2021-04-16: 50 ug via INTRAVENOUS

## 2021-04-16 MED ORDER — FENTANYL CITRATE (PF) 100 MCG/2ML IJ SOLN
100.0000 ug | Freq: Once | INTRAMUSCULAR | Status: AC
  Administered 2021-04-16: 100 ug via INTRAVENOUS

## 2021-04-16 NOTE — ED Notes (Signed)
Report given to [facility]. 

## 2021-04-16 NOTE — ED Notes (Signed)
Called Stark Bray (wife) and notified transferred to [facility] room 2721. Wife denied questions at this time.

## 2021-04-16 NOTE — ED Notes (Addendum)
Patient wife Micheal Frost, can be reached at [phone]. Leaving at this time. Requesting to be called with update on transport. MD discussing patient wishes. Per patient wife, full code at this time.

## 2021-04-16 NOTE — ED Notes (Signed)
Patient coughing frequently. Oral suctioning and deep suctioning completed at this time. Patient coughing less frequently but appears agitated. Fentanyl increased and propofol titrated down due to patient bp. Provider aware.

## 2021-04-16 NOTE — ED Notes (Signed)
Patient hypotensive. MD aware. See orders.

## 2021-04-16 NOTE — Progress Notes (Signed)
PHARMACY -  BRIEF ANTIBIOTIC NOTE   Pharmacy has received consult(s) for Cefepime and Vancomycin from an ED provider.  The patient's profile has been reviewed for ht/wt/allergies/indication/available labs.    One time order(s) placed for Cefepime 2 gm and Vancomycin 2 gm per pt wt: 90 kg.  Further antibiotics/pharmacy consults should be ordered by admitting physician if indicated.                       Otelia Sergeant, PharmD, [facility] 04/16/2021 4:17 AM

## 2021-04-16 NOTE — Progress Notes (Deleted)
RT at bedside to place patient on BIPAP per results of ABG. Pt is still a little lethargic but does answer when you call his name loud and talk to him. PT tolerated device fine without any complications and daughter is at the bedside. Plan is to take patient to ICU with BIPAP when bed is ready and available.

## 2021-04-16 NOTE — ED Notes (Signed)
ED Provider at bedside. 

## 2021-04-16 NOTE — ED Notes (Addendum)
Patient rolled onto side for rectal medication administration. Pt began coughing frequently and suctioned with little relief. Appears to be rice and gravy in ET tube. RT called to bedside for evaluation. MD aware and chest x-ray ordered.

## 2021-04-16 NOTE — ED Notes (Signed)
Difficulty finding critical care transport. Patient is on transport waiting list for [facility] and [facility] with mutual aid called by Gregary Signs at [facility]. MD, Charge RN, assigned RN all aware.

## 2021-04-16 NOTE — ED Notes (Signed)
Patient remains hypotensive. Per MD, completely stop propofol. Plan to order alternative sedation medication.

## 2021-04-16 NOTE — ED Provider Notes (Addendum)
Physical Exam  BP (!) 82/53   Pulse 63   Temp (!) 96.9 F (36.1 C)   Resp 18   Ht 6' (1.829 m)   Wt 90 kg   SpO2 100%   BMI 26.91 kg/m   Physical Exam  ED Course/Procedures   Clinical Course as of 04/16/21 0754  Sun Apr 16, 2021  0710 [facility] called and I urged them to make this patient a priority in transport. I'm told that he's first on the list for the AM crew [DS]  0732 Left subclavian line placed by me [DS]  [phone] [facility] transport arriving now [DS]    Clinical Course User Index [DS] Delton Prairie, MD    Procedures  MDM  12:00 AM  Assumed care of patient.  Patient intubated and sedated for concerns for status epilepticus.  No ICU beds at [facility].  Needs continuous EEG.  Patient accepted to [facility] by Dr. Dan Humphreys with neurology.  Awaiting transportation.    4:00 AM  Patient intubated by previous provider and was initially on fentanyl and propofol for sedation.  Had received IV 2 g Keppra.  Patient has had drop in blood pressure after intubation likely due to sedation and positive pressure ventilation.  Improved with IV fluids and discontinuation of propofol.  Patient doing well on fentanyl and Versed with no further seizure activity.  Unfortunately blood pressure begins to drop again.  We will continue to hydrate.  We will add on blood cultures as well as lactic, procalcitonin however these measures could be elevated secondary to seizure.  Does have a mild leukocytosis of 12,000.  Temp Foley shows temperature of 96.9 at this time.  Will cover with broad-spectrum antibiotics at this time in case of sepsis.  Chest x-ray clear.  Urine does not appear infected.  He is accepted to [facility] and is currently awaiting transport.  We have contacted [facility], [facility] and [facility] for transport of this critically ill patient.    Also has elevated troponins.  Cardiology here was consulted, Dr. Juliann Pares.  Did not recommend emergent intervention.  EKG nonischemic.  Will give rectal aspirin.  Repeat  troponin ordered.  Repeat EKG ordered.  Holding on heparin at this time.  I have gone ahead and ordered Keppra 1500 mg twice daily to be redosed at 7:30 AM.  He received his loading dose at 7:30 PM last night.   4:25 AM Repeat EKG nonischemic.  Patient is fluid responsive.  Blood pressure currently 114/70.  Intubated, sedated, stable.  No ground transport available at this time.  [facility] air care is performing a weather check to determine if patient can be flown to [facility].  4:33 AM  Weather is too unsafe for air travel at this time.  We are continuing to work on obtaining ground transport for patient.  5:55 AM Nurse concerned that patient may have aspirated.  She states he has a "gravy like" liquid in his endotracheal tube and now appears more uncomfortable requiring fentanyl boluses.  Repeat chest x-ray obtained and shows no signs of aspiration, infiltrate.  Lungs CTAB.  6:15 AM  D/w [facility] as [facility] continues to not have ground transport availability.  [facility] only has 2 trucks available overnight.  Patient is first on the list to be transported to [facility] where he has a bed available.  7:35 AM  Pt's troponin continues to rise, now at 1900.  He has received rectal aspirin.  Patient's MAPs continue to drop despite aggressive hydration in the ED.  A  central line was placed by Dr. Katrinka Blazing and pressors started.  I reviewed all nursing notes and pertinent previous records as available.  I have reviewed and interpreted any EKGs, lab and urine results, imaging (as available).   CRITICAL CARE Performed by: Rochele Raring   Total critical care time: 90 minutes  Critical care time was exclusive of separately billable procedures and treating other patients.  Critical care was necessary to treat or prevent imminent or life-threatening deterioration.  Critical care was time spent personally by me on the following activities: development of treatment plan with patient and/or surrogate as well as nursing,  discussions with consultants, evaluation of patient's response to treatment, examination of patient, obtaining history from patient or surrogate, ordering and performing treatments and interventions, ordering and review of laboratory studies, ordering and review of radiographic studies, pulse oximetry and re-evaluation of patient's condition.    EKG Interpretation  Date/Time:  Sunday Apr 16 2021 04:06:12 EDT Ventricular Rate:  62 PR Interval:  247 QRS Duration: 104 QT Interval:  460 QTC Calculation: 468 R Axis:   55 Text Interpretation: Sinus rhythm Prolonged PR interval Confirmed by Rochele Raring [phone] on 04/16/2021 4:25:26 AM         Ajani Rineer, Layla Maw, DO 04/16/21 0737    Kaamil Morefield, Layla Maw, DO 04/16/21 8657

## 2021-04-16 NOTE — ED Provider Notes (Addendum)
Pt received in signout from Dr. Elesa Massed at 7am pending transport to [facility] Neuro[facility]. Pt has already been accepted to the ICU, but we have been awaiting transport for a few hours. [facility] had no ICU beds available and [facility] had no transport available.   When I first assess the patient at the beginning of my shift, he had already received 3.5L isotonic fluid and had MAPs in the low 60s. I placed a left subclavian central line and initiated low dose pressors to assist with his perfusion, and to allow increased sedation in the setting of his status epilepticus. No signs of seizure activity on my brief exam. Uptrending troponins likely due to demand mismatch and critical illness, EKG without STEMI, and rectal aspirin was provided.   Marland Kitchen1-3 Lead EKG Interpretation Performed by: Delton Prairie, MD Authorized by: Delton Prairie, MD     Interpretation: normal     ECG rate:  72   ECG rate assessment: normal     Rhythm: sinus rhythm     Ectopy: none     Conduction: normal   .Critical Care Performed by: Delton Prairie, MD Authorized by: Delton Prairie, MD   Critical care provider statement:    Critical care time (minutes):  30   Critical care was time spent personally by me on the following activities:  Discussions with consultants, evaluation of patient's response to treatment, examination of patient, ordering and performing treatments and interventions, ordering and review of laboratory studies, ordering and review of radiographic studies, pulse oximetry, re-evaluation of patient's condition, obtaining history from patient or surrogate and review of old charts .Central Line  Date/Time: 04/16/2021 7:57 AM Performed by: Delton Prairie, MD Authorized by: Delton Prairie, MD   Consent:    Consent obtained:  Emergent situation Pre-procedure details:    Indication(s): central venous access and insufficient peripheral access     Sterile barrier technique: All elements of maximal sterile technique followed     Skin  preparation:  Chlorhexidine   Skin preparation agent: Skin preparation agent completely dried prior to procedure   Sedation:    Sedation type:  Deep Procedure details:    Location:  L subclavian   Patient position:  Supine   Procedural supplies:  Triple lumen   Landmarks identified: yes     Ultrasound guidance: no     Number of attempts:  1   Successful placement: yes   Post-procedure details:    Post-procedure:  Dressing applied and line sutured   Assessment:  Blood return through all ports, no pneumothorax on x-ray, free fluid flow and placement verified by x-ray   Procedure completion:  Tolerated well, no immediate complications   Complications:  None apparent, well tolerated    Clinical Course as of 04/16/21 0814  Sun Apr 16, 2021  0710 [facility] called and I urged them to make this patient a priority in transport. I'm told that he's first on the list for the AM crew [DS]  0732 Left subclavian line placed by me [DS]  0752 [facility] transport arriving now [DS]  0800 MAPs improved on 68mcg/min levophed. Transport packing him up [DS]  0805 Informed of hypoglycemia to the 50s. D50 and D5W ordered [DS]    Clinical Course User Index [DS] Delton Prairie, MD      Delton Prairie, MD 04/16/21 2706    Delton Prairie, MD 04/16/21 [id number]

## 2021-04-16 NOTE — ED Notes (Signed)
X-ray at bedside

## 2021-04-16 NOTE — ED Notes (Signed)
Xray at bedside to confirm NG tube placement.

## 2021-04-16 NOTE — ED Notes (Addendum)
Patient blood pressure continuing to stay low despite titrating down on propofol. Per MD, decrease fentanyl at this time. Provider aware of continued hypotension.

## 2021-04-16 NOTE — ED Notes (Signed)
Patient continues to be hypotensive despite titrating Propofol down per protocol. MD aware.

## 2021-04-16 NOTE — ED Notes (Signed)
Pt glucose noted to be 39 upon CBG prior to transfer to [facility] by [facility]. 1 amp D50 and D5W started per MD orders.

## 2021-04-16 NOTE — ED Notes (Signed)
Patient blood pressure improved after propofol administration initially but now repeat bp trending down. MD aware. See orders.

## 2021-04-16 NOTE — ED Notes (Signed)
[facility] here for transport.  

## 2021-04-16 NOTE — ED Notes (Signed)
Patient hypotensive. Provider aware. See orders.

## 2021-04-16 NOTE — ED Notes (Signed)
Provider aware one set of cultures obtained. Difficulty obtaining second set. Plan to start abx.

## 2021-04-16 NOTE — ED Notes (Signed)
Oral suctioning complete at this time.

## 2021-04-17 LAB — CBG MONITORING, ED: Glucose-Capillary: 116 mg/dL — ABNORMAL HIGH (ref 70–99)

## 2021-05-01 LAB — BLOOD GAS, ARTERIAL
Acid-base deficit: 6.3 mmol/L — ABNORMAL HIGH (ref 0.0–2.0)
Bicarbonate: 19 mmol/L — ABNORMAL LOW (ref 20.0–28.0)
FIO2: 1
MECHVT: 500 mL
O2 Saturation: 74.6 %
Patient temperature: 37
RATE: 20 resp/min
pCO2 arterial: 36 mmHg (ref 32.0–48.0)
pH, Arterial: 7.33 — ABNORMAL LOW (ref 7.350–7.450)
pO2, Arterial: 43 mmHg — ABNORMAL LOW (ref 83.0–108.0)

## 2022-01-22 IMAGING — DX DG CHEST 1V PORT
2 series · 2 of 2 positions shown · non-contrast
Comparison: 9559 hours today.

CLINICAL DATA: 65-year-old male with vomiting, possible aspiration.
Central line placement.

EXAM:
PORTABLE CHEST 1 VIEW

[chest ap (1 of 2)]
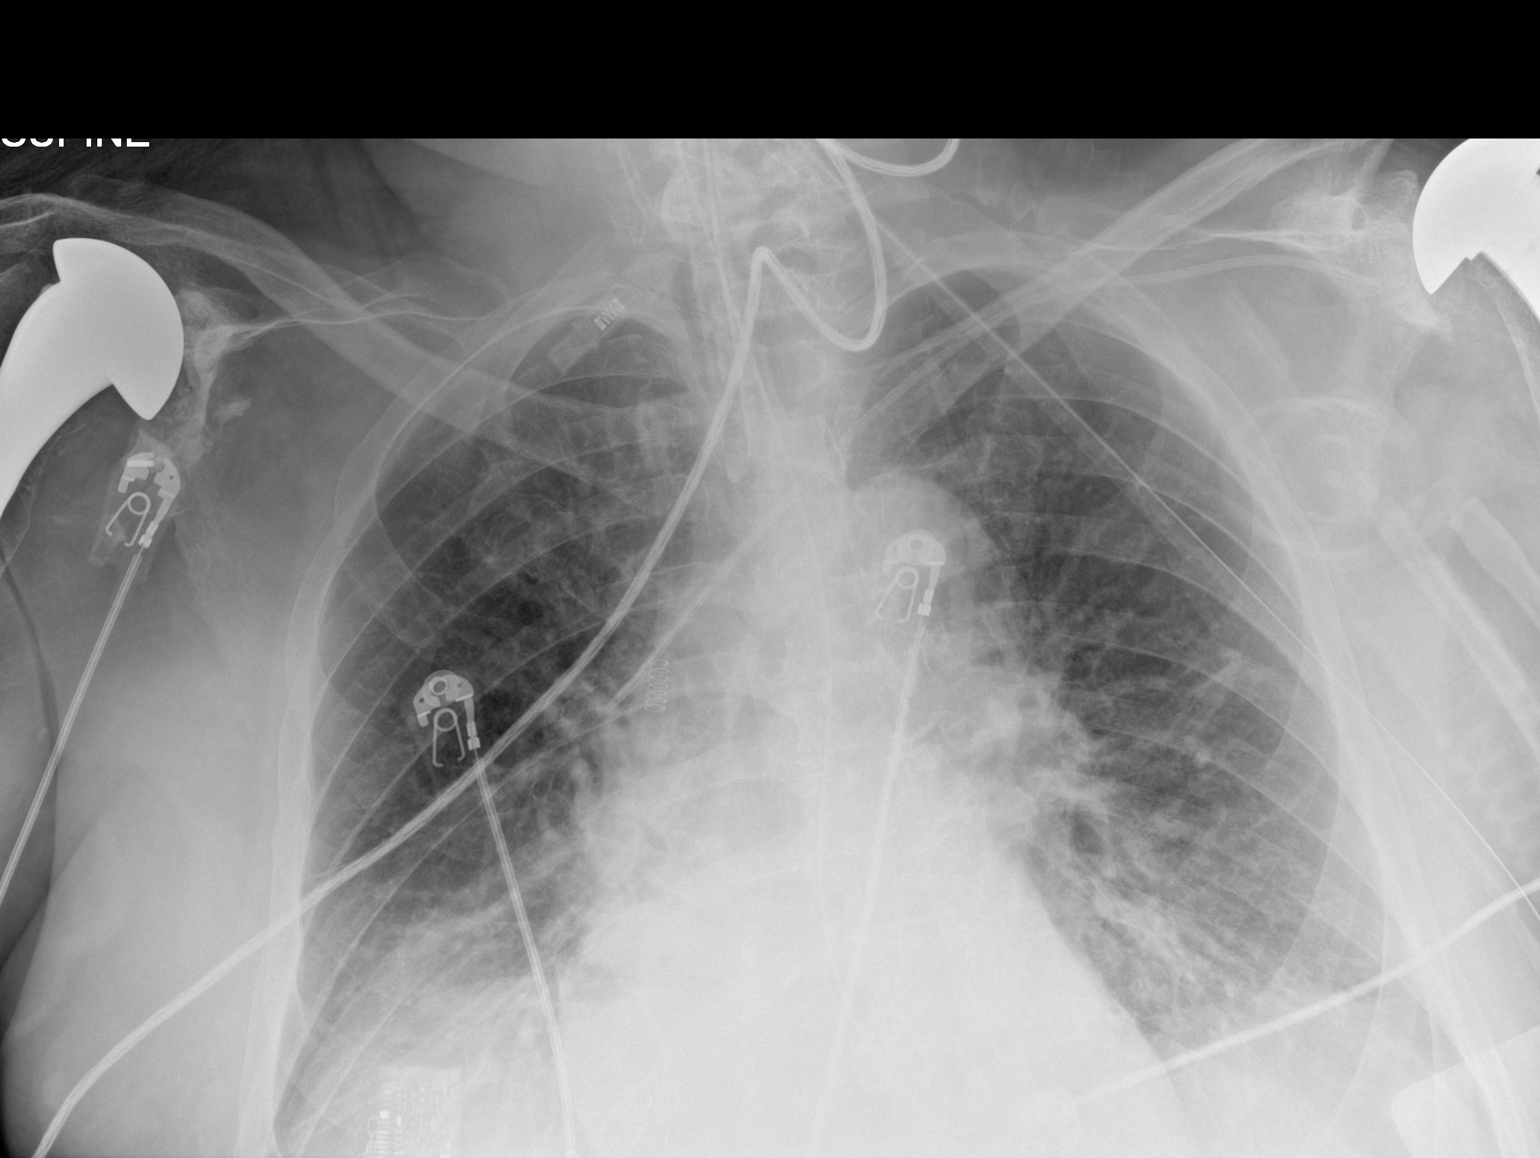

[chest ap (2 of 2)]
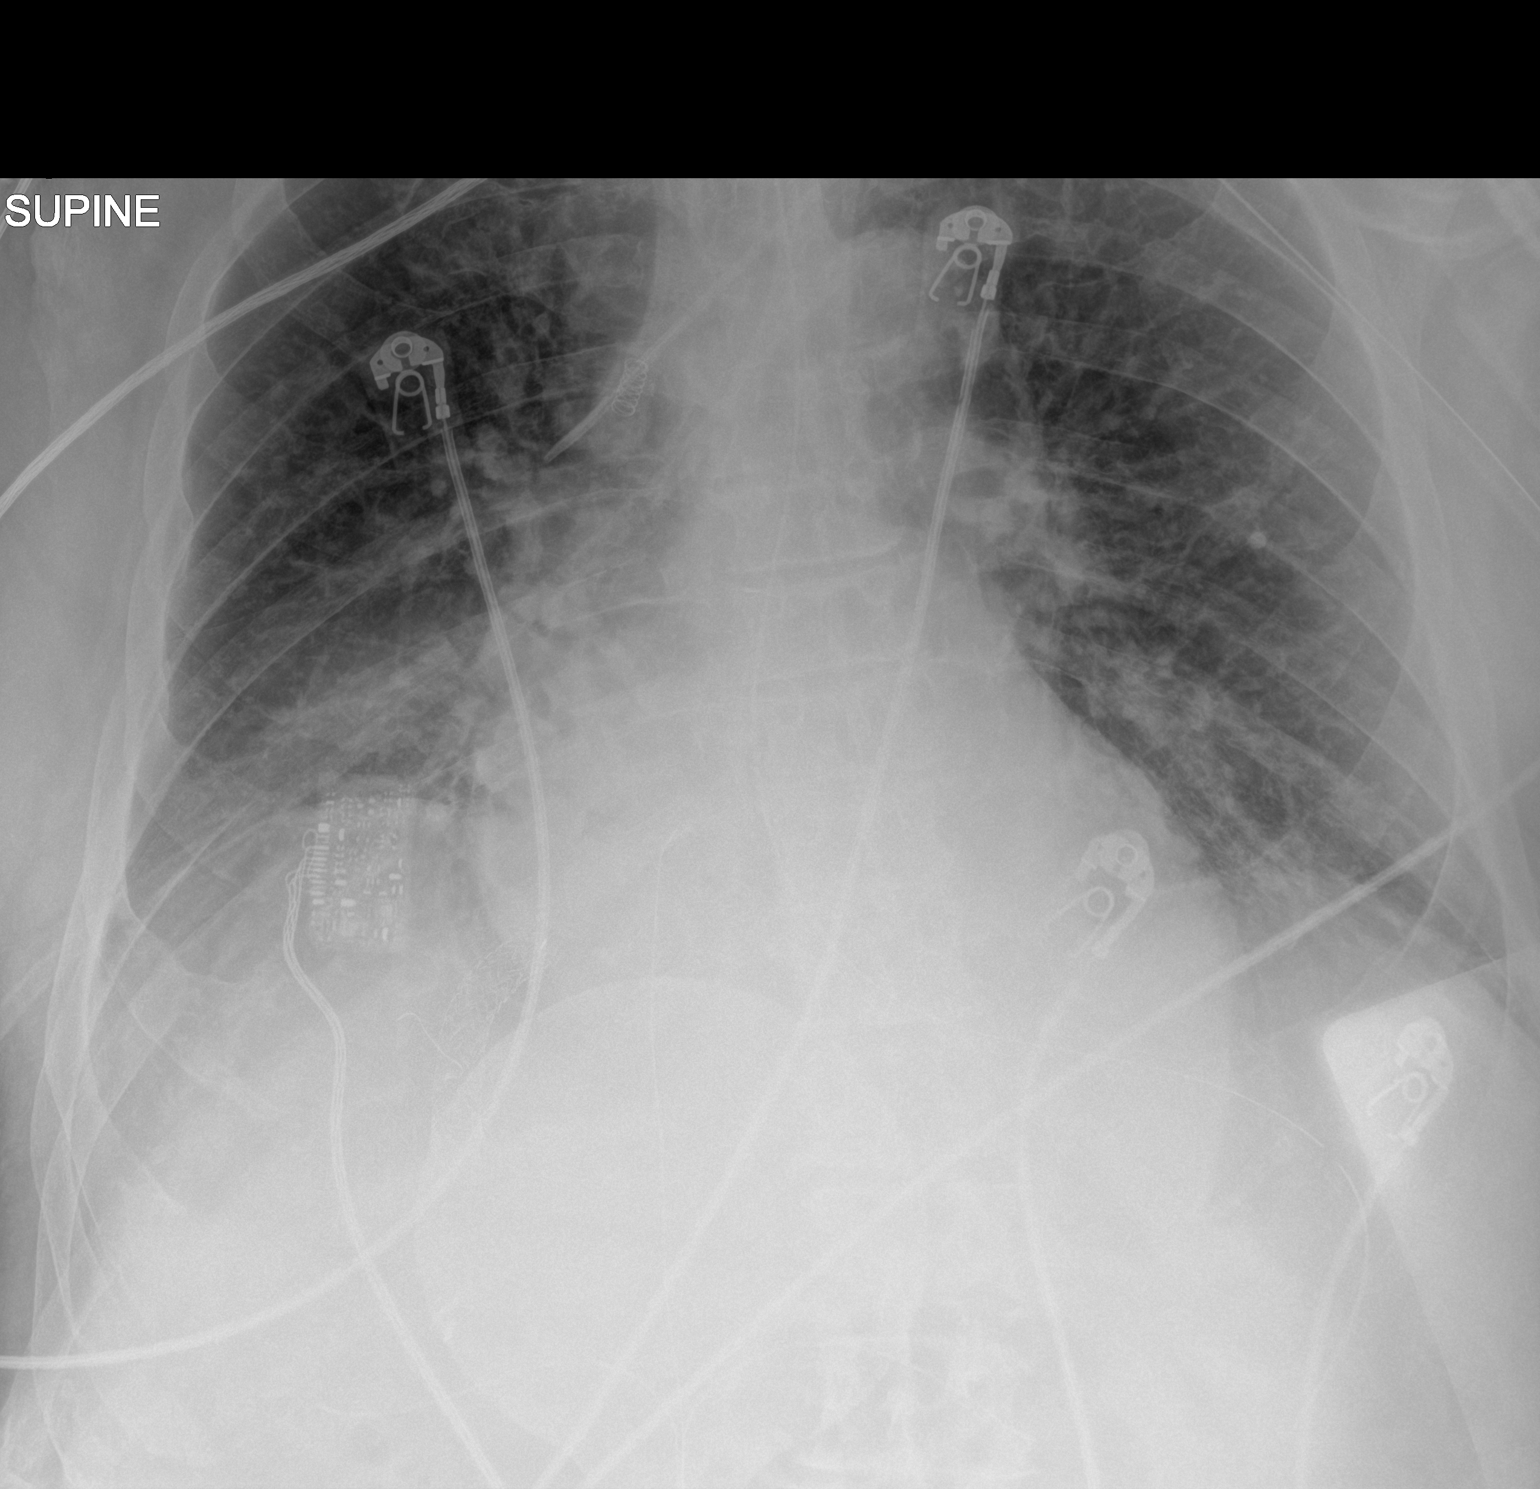

[2 of 2 positions shown; findings below may reference images not displayed]

FINDINGS: Portable AP supine view at 8266 hours. New left subclavian approach
central line. Catheter tip projects at the right mediastinal margin
at the level of the carina compatible with SVC placement. Otherwise
stable lines and tubes.

No pneumothorax. Stable mediastinal contours. Lung volumes and
ventilation not significantly changed allowing for changes in
patient positioning.
IMPRESSION: 1. New left subclavian approach central line with tip at the level
of the SVC. No pneumothorax.
2. Otherwise stable lines and tubes.
3. Stable ventilation.

## 2024-03-26 DEATH — deceased
